# Patient Record
Sex: Male | Born: 1985 | Hispanic: No | Marital: Single | Smoking: Never smoker
Health system: Southern US, Community
[De-identification: ages and names within clinical notes are randomized; demographics above are authoritative.]

---

## 2006-08-21 ENCOUNTER — Encounter: Admission: RE | Admit: 2006-08-21 | Discharge: 2006-08-21 | Payer: Self-pay | Admitting: Gastroenterology

## 2008-08-30 ENCOUNTER — Emergency Department (HOSPITAL_COMMUNITY): Admission: EM | Admit: 2008-08-30 | Discharge: 2008-08-30 | Payer: Self-pay | Admitting: Family Medicine

## 2008-10-28 ENCOUNTER — Emergency Department (HOSPITAL_COMMUNITY): Admission: EM | Admit: 2008-10-28 | Discharge: 2008-10-28 | Payer: Self-pay | Admitting: Family Medicine

## 2009-06-02 ENCOUNTER — Emergency Department (HOSPITAL_COMMUNITY): Admission: EM | Admit: 2009-06-02 | Discharge: 2009-06-02 | Payer: Self-pay | Admitting: Family Medicine

## 2009-12-28 ENCOUNTER — Ambulatory Visit: Payer: Self-pay | Admitting: Internal Medicine

## 2009-12-28 DIAGNOSIS — S8990XA Unspecified injury of unspecified lower leg, initial encounter: Secondary | ICD-10-CM

## 2009-12-28 DIAGNOSIS — S99919A Unspecified injury of unspecified ankle, initial encounter: Secondary | ICD-10-CM

## 2009-12-28 DIAGNOSIS — S99929A Unspecified injury of unspecified foot, initial encounter: Secondary | ICD-10-CM | POA: Insufficient documentation

## 2010-01-04 ENCOUNTER — Encounter: Payer: Self-pay | Admitting: Internal Medicine

## 2010-02-01 ENCOUNTER — Encounter: Payer: Self-pay | Admitting: Internal Medicine

## 2010-03-04 ENCOUNTER — Encounter: Payer: Self-pay | Admitting: Internal Medicine

## 2010-03-05 LAB — CONVERTED CEMR LAB: Triglyceride fasting, serum: 227 mg/dL

## 2010-03-29 ENCOUNTER — Ambulatory Visit
Admission: RE | Admit: 2010-03-29 | Discharge: 2010-03-29 | Payer: Self-pay | Source: Home / Self Care | Attending: Internal Medicine | Admitting: Internal Medicine

## 2010-03-29 DIAGNOSIS — E785 Hyperlipidemia, unspecified: Secondary | ICD-10-CM | POA: Insufficient documentation
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
## 2010-04-07 NOTE — Assessment & Plan Note (Signed)
Summary: elev cholesterol/#/cd   Vital Signs:  Patient profile:   25 year old male Height:      71 inches Weight:      166 pounds BMI:     23.24 O2 Sat:      98 % on Room air Temp:     98.6 degrees F oral Pulse rate:   79 / minute Pulse rhythm:   regular Resp:     16 per minute BP sitting:   126 / 70  (left arm) Cuff size:   regular  Vitals Entered By: Rock Nephew CMA (March 29, 2010 1:58 PM)  O2 Flow:  Room air CC: follow-up visit//discuss labs Is Patient Diabetic? No Pain Assessment Patient in pain? no       Does patient need assistance? Functional Status Self care Ambulation Normal   Primary Care Provider:  Etta Grandchild MD  CC:  follow-up visit//discuss labs.  History of Present Illness: Dakota Hill. Dakota was started on Acutane almost one year ago and while Dakota was on Acutane his Hill worsened. Dakota has since stopped Acutane. Dakota feels well today with no complaints.  Preventive Screening-Counseling & Management  Alcohol-Tobacco     Alcohol drinks/day: <1     Alcohol type: all     >5/day in last 3 mos: no     Alcohol Counseling: not indicated; use of alcohol is not excessive or problematic     Feels need to cut down: no     Feels annoyed by complaints: no     Feels guilty re: drinking: no     Needs 'eye opener' in am: no     Smoking Status: never     Tobacco Counseling: not indicated; no tobacco use  Hep-HIV-STD-Contraception     Hepatitis Risk: no risk noted     HIV Risk: no risk noted     STD Risk: no risk noted      Drug Use:  no.        Blood Transfusions:  no.    Clinical Review Panels:  Lipid Management   Cholesterol:  283 (03/05/2010)   LDL (bad choesterol):  149 (03/05/2010)   HDL (good cholesterol):  39 (03/05/2010)   Triglycerides:  227 (03/05/2010)   -  Date:  03/05/2010    Cholesterol: 283    LDL: 149    HDL: 39    Triglycerides: 409    Current Lipid Medications VLDL-45, total chol/HDL  6.0 Labs done at [facility]  Medications Prior to Update: 1)  Vimovo 500-20 Mg Tbec (Naproxen-Esomeprazole) .... 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 Family History Hypertension  Social History: Reviewed history from 12/28/2009 and no changes required. 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    Physical Exam  General:  alert, well-developed, well-nourished, well-hydrated, appropriate dress, normal appearance, and healthy-appearing.   Mouth:  Oral mucosa and oropharynx without lesions or exudates.  Teeth in good repair. Neck:  supple, full ROM, no masses, no thyromegaly, no JVD, normal carotid upstroke, and no carotid bruits.   Lungs:  normal respiratory effort, no intercostal retractions, no accessory muscle use, normal breath sounds, no dullness, no fremitus, no crackles, and no wheezes.   Heart:  normal rate, regular rhythm, no murmur, no gallop, and no rub.     Abdomen:  soft, non-tender, normal bowel sounds, no distention, no masses, no guarding, no rigidity, no rebound tenderness, no abdominal hernia, and no inguinal hernia.   Msk:  left 5th toe is slightly swollen with mild diffuse ttp but no ecchymosis, abrasions, wounds, or boney derangements. there is good sensation and capillary refill. Pulses:  R and L carotid,radial,femoral,dorsalis pedis and posterior tibial pulses are full and equal bilaterally Extremities:  No clubbing, cyanosis, edema, or deformity noted with normal full range of motion of all joints.   Neurologic:  No cranial nerve deficits noted. Station and gait are normal. Plantar reflexes are down-going bilaterally. DTRs are symmetrical throughout. Sensory, motor and coordinative functions appear intact. Skin:  Intact without suspicious lesions or rashes Cervical Nodes:  No lymphadenopathy noted Psych:  Cognition and judgment appear intact. Alert and cooperative with normal attention span and concentration. No apparent delusions, illusions, hallucinations   Impression & Recommendations:  Problem # 1:  HYPERLIPIDEMIA (ICD-272.4) Assessment New no meds necessary, wil recheck FLP in the next few months   HDL:39 (03/05/2010)  LDL:149 (03/05/2010)  Chol:283 (03/05/2010)  Trig:227 (03/05/2010)  Patient Instructions: 1)  Please schedule a follow-up appointment in 3 months.   Orders Added: 1)  Est. Patient Level III [04540]

## 2010-05-19 LAB — POCT RAPID STREP A (OFFICE): Streptococcus, Group A Screen (Direct): POSITIVE — AB

## 2010-06-05 LAB — POCT RAPID STREP A (OFFICE): Streptococcus, Group A Screen (Direct): NEGATIVE

## 2010-06-05 LAB — POCT INFECTIOUS MONO SCREEN: Mono Screen: NEGATIVE

## 2011-11-14 ENCOUNTER — Ambulatory Visit (INDEPENDENT_AMBULATORY_CARE_PROVIDER_SITE_OTHER): Payer: 59 | Admitting: Internal Medicine

## 2011-11-14 ENCOUNTER — Other Ambulatory Visit: Payer: 59

## 2011-11-14 ENCOUNTER — Encounter: Payer: Self-pay | Admitting: Internal Medicine

## 2011-11-14 VITALS — BP 136/74 | HR 70 | Temp 97.7°F | Resp 16 | Wt 149.0 lb

## 2011-11-14 DIAGNOSIS — N61 Mastitis without abscess: Secondary | ICD-10-CM

## 2011-11-14 DIAGNOSIS — Z23 Encounter for immunization: Secondary | ICD-10-CM

## 2011-11-14 MED ORDER — SULFAMETHOXAZOLE-TRIMETHOPRIM 800-160 MG PO TABS: 1.0000 | ORAL_TABLET | Freq: Two times a day (BID) | ORAL | Status: AC

## 2011-11-14 NOTE — Patient Instructions (Signed)

## 2011-11-15 ENCOUNTER — Encounter: Payer: Self-pay | Admitting: Internal Medicine

## 2011-11-15 NOTE — Assessment & Plan Note (Signed)
Culture sent, I am concerned about mrsa so I have asked him to start bactrim-ds

## 2011-11-15 NOTE — Progress Notes (Signed)
  Subjective:    Patient ID: Dakota Hill, male    DOB: 10-22-1985, 26 y.o.   MRN: [id number]  HPI  He returns c/o pain, drainage, swelling from his left nipple for the last 10 days. There was no preceding trauma or injury.  Review of Systems  Constitutional: Negative.   HENT: Negative.   Eyes: Negative.   Respiratory: Negative.   Cardiovascular: Negative.   Gastrointestinal: Negative.   Genitourinary: Negative.   Musculoskeletal: Negative.   Skin: Negative.   Neurological: Negative.   Hematological: Negative.   Psychiatric/Behavioral: Negative.        Objective:   Physical Exam  Vitals reviewed. Constitutional: He is oriented to person, place, and time. He appears well-developed and well-nourished. No distress.  HENT:  Head: Normocephalic and atraumatic.  Mouth/Throat: Oropharynx is clear and moist. No oropharyngeal exudate.  Eyes: Conjunctivae normal are normal. Right eye exhibits no discharge. Left eye exhibits no discharge. No scleral icterus.  Neck: Normal range of motion. Neck supple. No JVD present. No tracheal deviation present. No thyromegaly present.  Cardiovascular: Normal rate, regular rhythm, normal heart sounds and intact distal pulses.  Exam reveals no gallop and no friction rub.   No murmur heard. Pulmonary/Chest: Effort normal and breath sounds normal. No stridor. No respiratory distress. He has no wheezes. He has no rales. He exhibits no mass and no tenderness. Right breast exhibits no inverted nipple, no mass, no nipple discharge, no skin change and no tenderness. Left breast exhibits nipple discharge, skin change and tenderness. Left breast exhibits no inverted nipple and no mass. Breasts are asymmetrical.    Abdominal: Soft. Bowel sounds are normal. He exhibits no distension and no mass. There is no tenderness. There is no rebound and no guarding.  Musculoskeletal: Normal range of motion. He exhibits no edema and no tenderness.  Lymphadenopathy:    He has  no cervical adenopathy.  Neurological: He is oriented to person, place, and time.  Skin: Skin is warm and dry. No rash noted. He is not diaphoretic. No erythema. No pallor.  Psychiatric: He has a normal mood and affect. His behavior is normal. Judgment and thought content normal.          Assessment & Plan:

## 2011-11-17 LAB — WOUND CULTURE

## 2011-11-23 ENCOUNTER — Telehealth: Payer: Self-pay | Admitting: *Deleted

## 2011-11-23 NOTE — Telephone Encounter (Signed)
Culture was positive for staph, the antibiotics should have been effective

## 2011-11-23 NOTE — Telephone Encounter (Signed)
Pt requesting results from last week's OV-he has not heard anything.

## 2011-11-24 NOTE — Telephone Encounter (Signed)
Patient notified

## 2012-04-16 ENCOUNTER — Ambulatory Visit (INDEPENDENT_AMBULATORY_CARE_PROVIDER_SITE_OTHER): Payer: 59 | Admitting: Internal Medicine

## 2012-04-16 ENCOUNTER — Encounter: Payer: Self-pay | Admitting: Internal Medicine

## 2012-04-16 VITALS — BP 124/72 | HR 77 | Temp 98.5°F | Ht 71.0 in | Wt 160.0 lb

## 2012-04-16 DIAGNOSIS — B029 Zoster without complications: Secondary | ICD-10-CM

## 2012-04-16 MED ORDER — GABAPENTIN 100 MG PO CAPS: 100.0000 mg | ORAL_CAPSULE | Freq: Three times a day (TID) | ORAL | Status: DC

## 2012-04-16 MED ORDER — VALACYCLOVIR HCL 1 G PO TABS: 1000.0000 mg | ORAL_TABLET | Freq: Three times a day (TID) | ORAL | Status: DC

## 2012-04-16 NOTE — Progress Notes (Signed)
  Subjective:    Patient ID: Dakota Hill, male    DOB: [birth year], 27 y.o.   MRN: [id number]  HPI  Pt presents to the clinic today with c/o a rash on the right side of his waist. He noticed it on Friday evening. It is painful. He has never had a rash like this before. He thinks it may be shingles. The pain is sharp and shooting. He feels like his belt is irritating the area. He has had chicken pox as a child.  Review of Systems  No past medical history on file.  No current outpatient prescriptions on file.   No current facility-administered medications for this visit.    No Known Allergies  Family History  Problem Relation Age of Onset  . Hypertension Other     History   Social History  . Marital Status: Single    Spouse Name: N/A    Number of Children: N/A  . Years of Education: N/A   Occupational History  . Not on file.   Social History Main Topics  . Smoking status: Never Smoker   . Smokeless tobacco: Not on file  . Alcohol Use: No  . Drug Use: No  . Sexually Active: Not Currently   Other Topics Concern  . Not on file   Social History Narrative  . No narrative on file     Constitutional: Denies fever, malaise, fatigue, headache or abrupt weight changes.  Skin: Pt reports rash on right side of abdomen.  Neurological: Denies dizziness, difficulty with memory, difficulty with speech or problems with balance and coordination.   No other specific complaints in a complete review of systems (except as listed in HPI above).     Objective:   Physical Exam   BP 124/72  Pulse 77  Temp(Src) 98.5 F (36.9 C) (Oral)  Ht 5\' 11"  (1.803 m)  Wt 160 lb (72.576 kg)  BMI 22.33 kg/m2  SpO2 98% Wt Readings from Last 3 Encounters:  04/16/12 160 lb (72.576 kg)  11/14/11 149 lb (67.586 kg)  03/29/10 166 lb (75.297 kg)    General: Appears their stated age, well developed, well nourished in NAD. Skin: Linear patch of vesicular lesions on erythematous base resembling  shingles on right side of abdomen.  Cardiovascular: Normal rate and rhythm. S1,S2 noted.  No murmur, rubs or gallops noted. No JVD or BLE edema. No carotid bruits noted. Pulmonary/Chest: Normal effort and positive vesicular breath sounds. No respiratory distress. No wheezes, rales or ronchi noted.  Abdomen: Soft and nontender. Normal bowel sounds, no bruits noted. No distention or masses noted. Liver, spleen and kidneys non palpable.  Neurological: Alert and oriented. Cranial nerves II-XII intact. Coordination normal. +DTRs bilaterally.         Assessment & Plan:   Shingles, new onset with additional workup required:  eRx for Valtrex TID x 7 days eRx for Neurontin TID x 7-14 days Wash with warm soap and water  RTC as needed or if symptoms persist

## 2012-04-16 NOTE — Patient Instructions (Signed)

## 2013-05-08 ENCOUNTER — Ambulatory Visit: Payer: 59 | Admitting: Internal Medicine

## 2013-10-14 ENCOUNTER — Encounter: Payer: Self-pay | Admitting: Internal Medicine

## 2013-10-14 ENCOUNTER — Ambulatory Visit (INDEPENDENT_AMBULATORY_CARE_PROVIDER_SITE_OTHER): Payer: Managed Care, Other (non HMO) | Admitting: Internal Medicine

## 2013-10-14 ENCOUNTER — Other Ambulatory Visit (INDEPENDENT_AMBULATORY_CARE_PROVIDER_SITE_OTHER): Payer: Managed Care, Other (non HMO)

## 2013-10-14 VITALS — BP 138/72 | HR 77 | Temp 98.4°F | Resp 16 | Ht 71.0 in | Wt 170.0 lb

## 2013-10-14 DIAGNOSIS — K602 Anal fissure, unspecified: Secondary | ICD-10-CM

## 2013-10-14 DIAGNOSIS — L649 Androgenic alopecia, unspecified: Secondary | ICD-10-CM | POA: Insufficient documentation

## 2013-10-14 DIAGNOSIS — Z0000 Encounter for general adult medical examination without abnormal findings: Secondary | ICD-10-CM | POA: Insufficient documentation

## 2013-10-14 DIAGNOSIS — L658 Other specified nonscarring hair loss: Secondary | ICD-10-CM

## 2013-10-14 LAB — CBC WITH DIFFERENTIAL/PLATELET
BASOS ABS: 0 10*3/uL (ref 0.0–0.1)
Basophils Relative: 0.8 % (ref 0.0–3.0)
EOS PCT: 2.6 % (ref 0.0–5.0)
Eosinophils Absolute: 0.1 10*3/uL (ref 0.0–0.7)
HCT: 45.4 % (ref 39.0–52.0)
Hemoglobin: 15.5 g/dL (ref 13.0–17.0)
Lymphocytes Relative: 35.1 % (ref 12.0–46.0)
Lymphs Abs: 1.9 10*3/uL (ref 0.7–4.0)
MCHC: 34.2 g/dL (ref 30.0–36.0)
MCV: 89.1 fl (ref 78.0–100.0)
MONO ABS: 0.5 10*3/uL (ref 0.1–1.0)
Monocytes Relative: 8.7 % (ref 3.0–12.0)
NEUTROS PCT: 52.8 % (ref 43.0–77.0)
Neutro Abs: 2.9 10*3/uL (ref 1.4–7.7)
PLATELETS: 229 10*3/uL (ref 150.0–400.0)
RBC: 5.1 Mil/uL (ref 4.22–5.81)
RDW: 13.1 % (ref 11.5–15.5)
WBC: 5.4 10*3/uL (ref 4.0–10.5)

## 2013-10-14 LAB — LIPID PANEL
CHOLESTEROL: 211 mg/dL — AB (ref 0–200)
HDL: 44.8 mg/dL (ref >39.00)
LDL Cholesterol: 145 mg/dL — ABNORMAL HIGH (ref 0–99)
NonHDL: 166.2
Total CHOL/HDL Ratio: 5
Triglycerides: 108 mg/dL (ref 0.0–149.0)
VLDL: 21.6 mg/dL (ref 0.0–40.0)

## 2013-10-14 LAB — COMPREHENSIVE METABOLIC PANEL
ALBUMIN: 4.3 g/dL (ref 3.5–5.2)
ALK PHOS: 81 U/L (ref 39–117)
ALT: 37 U/L (ref 0–53)
AST: 76 U/L — ABNORMAL HIGH (ref 0–37)
BUN: 6 mg/dL (ref 6–23)
CO2: 26 meq/L (ref 19–32)
Calcium: 9.3 mg/dL (ref 8.4–10.5)
Chloride: 106 mEq/L (ref 96–112)
Creatinine, Ser: 0.9 mg/dL (ref 0.4–1.5)
GFR: 111.5 mL/min (ref >60.00)
GLUCOSE: 86 mg/dL (ref 70–99)
POTASSIUM: 4.3 meq/L (ref 3.5–5.1)
SODIUM: 142 meq/L (ref 135–145)
TOTAL PROTEIN: 7.1 g/dL (ref 6.0–8.3)
Total Bilirubin: 0.6 mg/dL (ref 0.2–1.2)

## 2013-10-14 LAB — TSH: TSH: 1.6 u[IU]/mL (ref 0.35–4.50)

## 2013-10-14 MED ORDER — FINASTERIDE 1 MG PO TABS: 1.0000 mg | ORAL_TABLET | Freq: Every day | ORAL | Status: DC

## 2013-10-14 MED ORDER — TRIAMCINOLONE ACETONIDE 0.5 % EX CREA: 1.0000 "application " | TOPICAL_CREAM | Freq: Three times a day (TID) | CUTANEOUS | Status: DC

## 2013-10-14 NOTE — Assessment & Plan Note (Signed)
Will start propecia

## 2013-10-14 NOTE — Assessment & Plan Note (Signed)
Exam done Vaccines were reviewed Labs ordered Pt ed material was given 

## 2013-10-14 NOTE — Progress Notes (Signed)
   Subjective:    Patient ID: Dakota Hill, male    DOB: 12/28/1985, 28 y.o.   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     Review of Systems  Constitutional: Negative.   HENT: Negative.   Eyes: Negative.   Respiratory: Negative.  Negative for cough, choking, chest tightness, shortness of breath and stridor.   Cardiovascular: Negative.  Negative for chest pain, palpitations and leg swelling.  Gastrointestinal: Positive for anal bleeding and rectal pain. Negative for nausea, vomiting, abdominal pain, diarrhea, constipation, blood in stool and abdominal distention.  Endocrine: Negative.   Genitourinary: Negative.  Negative for dysuria, urgency, decreased urine volume, penile swelling, scrotal swelling, enuresis, difficulty urinating, genital sores and penile pain.  Musculoskeletal: Negative.  Negative for arthralgias, back pain and myalgias.  Skin: Negative.  Negative for rash.  Allergic/Immunologic: Negative.   Neurological: Negative.   Hematological: Negative.  Negative for adenopathy. Does not bruise/bleed easily.  Psychiatric/Behavioral: Negative.        Objective:   Physical Exam  Vitals reviewed. Constitutional: He is oriented to person, place, and time. He appears well-developed and well-nourished.  HENT:  Head: Normocephalic and atraumatic.  Mouth/Throat: Oropharynx is clear and moist. No oropharyngeal exudate.  Eyes: Conjunctivae are normal. Right eye exhibits no discharge. Left eye exhibits no discharge. No scleral icterus.  Neck: Normal range of motion. Neck supple. No JVD present. No tracheal deviation present. No thyromegaly present.  Cardiovascular: Normal rate, regular rhythm, normal heart sounds and intact distal pulses.  Exam reveals no  gallop and no friction rub.   No murmur heard. Pulmonary/Chest: Effort normal and breath sounds normal. No stridor. No respiratory distress. He has no wheezes. He has no rales. He exhibits no tenderness.  Abdominal: Soft. Bowel sounds are normal. He exhibits no distension and no mass. There is no tenderness. There is no rebound and no guarding. Hernia confirmed negative in the right inguinal area and confirmed negative in the left inguinal area.  Genitourinary: Prostate normal and penis normal. Rectal exam shows internal hemorrhoid and fissure. Rectal exam shows no mass, no tenderness and anal tone normal. Guaiac negative stool. Prostate is not enlarged and not tender. Right testis shows no mass, no swelling and no tenderness. Right testis is descended. Left testis shows no mass, no swelling and no tenderness. Left testis is descended. Circumcised. No penile erythema or penile tenderness. No discharge found.     Musculoskeletal: Normal range of motion. He exhibits no edema and no tenderness.  Lymphadenopathy:    He has no cervical adenopathy.       Right: No inguinal adenopathy present.       Left: No inguinal adenopathy present.  Neurological: He is oriented to person, place, and time.  Skin: Skin is warm and dry. No rash noted. He is not diaphoretic. No erythema. No pallor.  Psychiatric: He has a normal mood and affect. His behavior is normal. Judgment and thought content normal.      Lab Results  Component Value Date   CHOL 283 03/05/2010   HDL 39 03/05/2010   LDLCALC 829149 03/05/2010      Assessment & Plan:

## 2013-10-14 NOTE — Assessment & Plan Note (Signed)
I am concerned that he may have contact dermatitis, will treat this with TAC cream for now I am also concerned about HSV 1 and 2 infection so will screen for that as well as HIV

## 2013-10-14 NOTE — Progress Notes (Signed)
Pre visit review using our clinic review tool, if applicable. No additional management support is needed unless otherwise documented below in the visit note. 

## 2013-10-14 NOTE — Patient Instructions (Signed)

## 2013-10-15 LAB — HSV(HERPES SMPLX)ABS-I+II(IGG+IGM)-BLD
HERPES SIMPLEX VRS I-IGM AB (EIA): 0.13 {index}
HSV 1 Glycoprotein G Ab, IgG: <0.1 IV
HSV 2 Glycoprotein G Ab, IgG: <0.1 IV

## 2013-10-15 LAB — HIV ANTIBODY (ROUTINE TESTING W REFLEX): HIV: NONREACTIVE

## 2013-10-17 ENCOUNTER — Encounter: Payer: Self-pay | Admitting: Internal Medicine

## 2013-12-13 ENCOUNTER — Other Ambulatory Visit: Payer: Self-pay

## 2014-10-11 ENCOUNTER — Encounter (HOSPITAL_BASED_OUTPATIENT_CLINIC_OR_DEPARTMENT_OTHER): Payer: Self-pay | Admitting: Emergency Medicine

## 2014-10-11 ENCOUNTER — Emergency Department (HOSPITAL_BASED_OUTPATIENT_CLINIC_OR_DEPARTMENT_OTHER): Payer: Managed Care, Other (non HMO)

## 2014-10-11 ENCOUNTER — Emergency Department (HOSPITAL_BASED_OUTPATIENT_CLINIC_OR_DEPARTMENT_OTHER)
Admission: EM | Admit: 2014-10-11 | Discharge: 2014-10-11 | Disposition: A | Discharge status: Home / Self Care | Payer: Managed Care, Other (non HMO) | Attending: Emergency Medicine | Admitting: Emergency Medicine

## 2014-10-11 DIAGNOSIS — M62838 Other muscle spasm: Secondary | ICD-10-CM | POA: Insufficient documentation

## 2014-10-11 DIAGNOSIS — Y92410 Unspecified street and highway as the place of occurrence of the external cause: Secondary | ICD-10-CM | POA: Diagnosis not present

## 2014-10-11 DIAGNOSIS — S4992XA Unspecified injury of left shoulder and upper arm, initial encounter: Secondary | ICD-10-CM | POA: Diagnosis present

## 2014-10-11 DIAGNOSIS — Y9389 Activity, other specified: Secondary | ICD-10-CM | POA: Diagnosis not present

## 2014-10-11 DIAGNOSIS — Y998 Other external cause status: Secondary | ICD-10-CM | POA: Diagnosis not present

## 2014-10-11 MED ORDER — METHOCARBAMOL 500 MG PO TABS: 500.0000 mg | ORAL_TABLET | Freq: Two times a day (BID) | ORAL | Status: DC

## 2014-10-11 MED ORDER — NAPROXEN 375 MG PO TABS: 375.0000 mg | ORAL_TABLET | Freq: Two times a day (BID) | ORAL | Status: DC

## 2014-10-11 MED ORDER — NAPROXEN 250 MG PO TABS
500.0000 mg | ORAL_TABLET | Freq: Once | ORAL | Status: AC
  Administered 2014-10-11: 500 mg via ORAL
  Filled 2014-10-11: qty 2

## 2014-10-11 MED ORDER — METHOCARBAMOL 500 MG PO TABS
1000.0000 mg | ORAL_TABLET | Freq: Once | ORAL | Status: AC
  Administered 2014-10-11: 1000 mg via ORAL
  Filled 2014-10-11: qty 2

## 2014-10-11 NOTE — Discharge Instructions (Signed)
Heat Therapy °Heat therapy can help make painful, stiff muscles and joints feel better. Do not use heat on new injuries. Wait at least 48 hours after an injury to use heat. Do not use heat when you have aches or pains right after an activity. If you still have pain 3 hours after stopping the activity, then you may use heat. °HOME CARE °Wet heat pack °· Soak a clean towel in warm water. Squeeze out the extra water. °· Put the warm, wet towel in a plastic bag. °· Place a thin, dry towel between your skin and the bag. °· Put the heat pack on the area for 5 minutes, and check your skin. Your skin may be pink, but it should not be red. °· Leave the heat pack on the area for 15 to 30 minutes. °· Repeat this every 2 to 4 hours while awake. Do not use heat while you are sleeping. °Warm water bath °· Fill a tub with warm water. °· Place the affected body part in the tub. °· Soak the area for 20 to 40 minutes. °· Repeat as needed. °Hot water bottle °· Fill the water bottle half full with hot water. °· Press out the extra air. Close the cap tightly. °· Place a dry towel between your skin and the bottle. °· Put the bottle on the area for 5 minutes, and check your skin. Your skin may be pink, but it should not be red. °· Leave the bottle on the area for 15 to 30 minutes. °· Repeat this every 2 to 4 hours while awake. °Electric heating pad °· Place a dry towel between your skin and the heating pad. °· Set the heating pad on low heat. °· Put the heating pad on the area for 10 minutes, and check your skin. Your skin may be pink, but it should not be red. °· Leave the heating pad on the area for 20 to 40 minutes. °· Repeat this every 2 to 4 hours while awake. °· Do not lie on the heating pad. °· Do not fall asleep while using the heating pad. °· Do not use the heating pad near water. °GET HELP RIGHT AWAY IF: °· You get blisters or red skin. °· Your skin is puffy (swollen), or you lose feeling (numbness) in the affected area. °· You  have any new problems. °· Your problems are getting worse. °· You have any questions or concerns. °If you have any problems, stop using heat therapy until you see your doctor. °MAKE SURE YOU: °· Understand these instructions. °· Will watch your condition. °· Will get help right away if you are not doing well or get worse. °Document Released: 05/09/2011 Document Reviewed: 04/09/2013 °[employer]® Patient Information ©2015 [employer]. This information is not intended to replace advice given to you by your health care provider. Make sure you discuss any questions you have with your health care provider. ° °
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
## 2014-10-29 ENCOUNTER — Ambulatory Visit (INDEPENDENT_AMBULATORY_CARE_PROVIDER_SITE_OTHER)
Admission: RE | Admit: 2014-10-29 | Discharge: 2014-10-29 | Disposition: A | Discharge status: Home / Self Care | Payer: Managed Care, Other (non HMO) | Source: Ambulatory Visit | Attending: Internal Medicine | Admitting: Internal Medicine

## 2014-10-29 ENCOUNTER — Encounter: Payer: Self-pay | Admitting: Internal Medicine

## 2014-10-29 ENCOUNTER — Ambulatory Visit (INDEPENDENT_AMBULATORY_CARE_PROVIDER_SITE_OTHER): Payer: Managed Care, Other (non HMO) | Admitting: Internal Medicine

## 2014-10-29 VITALS — BP 120/82 | HR 79 | Temp 98.3°F | Resp 16 | Ht 72.0 in | Wt 166.0 lb

## 2014-10-29 DIAGNOSIS — M546 Pain in thoracic spine: Secondary | ICD-10-CM | POA: Insufficient documentation

## 2014-10-29 DIAGNOSIS — M549 Dorsalgia, unspecified: Secondary | ICD-10-CM

## 2014-10-29 MED ORDER — NAPROXEN 375 MG PO TABS: 375.0000 mg | ORAL_TABLET | Freq: Two times a day (BID) | ORAL | Status: DC

## 2014-10-29 NOTE — Patient Instructions (Signed)
Back Pain, Adult Low back pain is very common. About 1 in 5 people have back pain.The cause of low back pain is rarely dangerous. The pain often gets better over time.About half of people with a sudden onset of back pain feel better in just 2 weeks. About 8 in 10 people feel better by 6 weeks.  CAUSES Some common causes of back pain include:  Strain of the muscles or ligaments supporting the spine.  Wear and tear (degeneration) of the spinal discs.  Arthritis.  Direct injury to the back. DIAGNOSIS Most of the time, the direct cause of low back pain is not known.However, back pain can be treated effectively even when the exact cause of the pain is unknown.Answering your caregiver's questions about your overall health and symptoms is one of the most accurate ways to make sure the cause of your pain is not dangerous. If your caregiver needs more information, he or she may order lab work or imaging tests (X-rays or MRIs).However, even if imaging tests show changes in your back, this usually does not require surgery. HOME CARE INSTRUCTIONS For many people, back pain returns.Since low back pain is rarely dangerous, it is often a condition that people can learn to manageon their own.   Remain active. It is stressful on the back to sit or stand in one place. Do not sit, drive, or stand in one place for more than 30 minutes at a time. Take short walks on level surfaces as soon as pain allows.Try to increase the length of time you walk each day.  Do not stay in bed.Resting more than 1 or 2 days can delay your recovery.  Do not avoid exercise or work.Your body is made to move.It is not dangerous to be active, even though your back may hurt.Your back will likely heal faster if you return to being active before your pain is gone.  Pay attention to your body when you bend and lift. Many people have less discomfortwhen lifting if they bend their knees, keep the load close to their bodies,and  avoid twisting. Often, the most comfortable positions are those that put less stress on your recovering back.  Find a comfortable position to sleep. Use a firm mattress and lie on your side with your knees slightly bent. If you lie on your back, put a pillow under your knees.  Only take over-the-counter or prescription medicines as directed by your caregiver. Over-the-counter medicines to reduce pain and inflammation are often the most helpful.Your caregiver may prescribe muscle relaxant drugs.These medicines help dull your pain so you can more quickly return to your normal activities and healthy exercise.  Put ice on the injured area.  Put ice in a plastic bag.  Place a towel between your skin and the bag.  Leave the ice on for 15-20 minutes, 03-04 times a day for the first 2 to 3 days. After that, ice and heat may be alternated to reduce pain and spasms.  Ask your caregiver about trying back exercises and gentle massage. This may be of some benefit.  Avoid feeling anxious or stressed.Stress increases muscle tension and can worsen back pain.It is important to recognize when you are anxious or stressed and learn ways to manage it.Exercise is a great option. SEEK MEDICAL CARE IF:  You have pain that is not relieved with rest or medicine.  You have pain that does not improve in 1 week.  You have new symptoms.  You are generally not feeling well. SEEK   IMMEDIATE MEDICAL CARE IF:   You have pain that radiates from your back into your legs.  You develop new bowel or bladder control problems.  You have unusual weakness or numbness in your arms or legs.  You develop nausea or vomiting.  You develop abdominal pain.  You feel faint. Document Released: 02/14/2005 Document Revised: 08/16/2011 Document Reviewed: 06/18/2013 Exit[employer] Patient Information 2015 [employer]. This information is not intended to replace advice given to you by your health care provider. Make sure you  discuss any questions you have with your health care provider.  

## 2014-10-29 NOTE — Progress Notes (Signed)
Pre visit review using our clinic review tool, if applicable. No additional management support is needed unless otherwise documented below in the visit note. 
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
## 2014-12-10 ENCOUNTER — Ambulatory Visit (INDEPENDENT_AMBULATORY_CARE_PROVIDER_SITE_OTHER): Payer: Managed Care, Other (non HMO) | Admitting: Internal Medicine

## 2014-12-10 ENCOUNTER — Other Ambulatory Visit (INDEPENDENT_AMBULATORY_CARE_PROVIDER_SITE_OTHER): Payer: Managed Care, Other (non HMO)

## 2014-12-10 ENCOUNTER — Encounter: Payer: Self-pay | Admitting: Internal Medicine

## 2014-12-10 VITALS — BP 128/60 | HR 69 | Temp 98.6°F | Resp 16 | Ht 72.0 in | Wt 161.0 lb

## 2014-12-10 DIAGNOSIS — Z0000 Encounter for general adult medical examination without abnormal findings: Secondary | ICD-10-CM

## 2014-12-10 DIAGNOSIS — L649 Androgenic alopecia, unspecified: Secondary | ICD-10-CM

## 2014-12-10 LAB — CBC WITH DIFFERENTIAL/PLATELET
BASOS ABS: 0 10*3/uL (ref 0.0–0.1)
BASOS PCT: 0.4 % (ref 0.0–3.0)
Eosinophils Absolute: 0.1 10*3/uL (ref 0.0–0.7)
Eosinophils Relative: 1.6 % (ref 0.0–5.0)
HEMATOCRIT: 48.9 % (ref 39.0–52.0)
Hemoglobin: 16.5 g/dL (ref 13.0–17.0)
LYMPHS ABS: 2.6 10*3/uL (ref 0.7–4.0)
LYMPHS PCT: 36.7 % (ref 12.0–46.0)
MCHC: 33.6 g/dL (ref 30.0–36.0)
MCV: 90.8 fl (ref 78.0–100.0)
MONOS PCT: 8.2 % (ref 3.0–12.0)
Monocytes Absolute: 0.6 10*3/uL (ref 0.1–1.0)
NEUTROS ABS: 3.8 10*3/uL (ref 1.4–7.7)
NEUTROS PCT: 53.1 % (ref 43.0–77.0)
PLATELETS: 237 10*3/uL (ref 150.0–400.0)
RBC: 5.39 Mil/uL (ref 4.22–5.81)
RDW: 12.4 % (ref 11.5–15.5)
WBC: 7.1 10*3/uL (ref 4.0–10.5)

## 2014-12-10 LAB — COMPREHENSIVE METABOLIC PANEL
ALT: 19 U/L (ref 0–53)
AST: 18 U/L (ref 0–37)
Albumin: 4.7 g/dL (ref 3.5–5.2)
Alkaline Phosphatase: 66 U/L (ref 39–117)
BILIRUBIN TOTAL: 0.8 mg/dL (ref 0.2–1.2)
BUN: 12 mg/dL (ref 6–23)
CALCIUM: 9.6 mg/dL (ref 8.4–10.5)
CHLORIDE: 104 meq/L (ref 96–112)
CO2: 30 meq/L (ref 19–32)
Creatinine, Ser: 0.82 mg/dL (ref 0.40–1.50)
GFR: 118.39 mL/min (ref >60.00)
GLUCOSE: 92 mg/dL (ref 70–99)
Potassium: 4.3 mEq/L (ref 3.5–5.1)
Sodium: 139 mEq/L (ref 135–145)
Total Protein: 7.4 g/dL (ref 6.0–8.3)

## 2014-12-10 LAB — URINALYSIS, ROUTINE W REFLEX MICROSCOPIC
BILIRUBIN URINE: NEGATIVE
Hgb urine dipstick: NEGATIVE
Ketones, ur: NEGATIVE
Leukocytes, UA: NEGATIVE
Nitrite: NEGATIVE
RBC / HPF: NONE SEEN (ref 0–?)
TOTAL PROTEIN, URINE-UPE24: NEGATIVE
Urine Glucose: NEGATIVE
Urobilinogen, UA: 0.2 (ref 0.0–1.0)
pH: 6 (ref 5.0–8.0)

## 2014-12-10 LAB — LIPID PANEL
CHOL/HDL RATIO: 5
Cholesterol: 229 mg/dL — ABNORMAL HIGH (ref 0–200)
HDL: 47.3 mg/dL (ref >39.00)
LDL CALC: 160 mg/dL — AB (ref 0–99)
NonHDL: 181.23
Triglycerides: 104 mg/dL (ref 0.0–149.0)
VLDL: 20.8 mg/dL (ref 0.0–40.0)

## 2014-12-10 LAB — TSH: TSH: 0.99 u[IU]/mL (ref 0.35–4.50)

## 2014-12-10 MED ORDER — FINASTERIDE 1 MG PO TABS: 1.0000 mg | ORAL_TABLET | Freq: Every day | ORAL | Status: AC

## 2014-12-10 NOTE — Patient Instructions (Signed)

## 2014-12-10 NOTE — Progress Notes (Signed)
Pre visit review using our clinic review tool, if applicable. No additional management support is needed unless otherwise documented below in the visit note. 

## 2014-12-11 ENCOUNTER — Encounter: Payer: Self-pay | Admitting: Internal Medicine

## 2014-12-11 LAB — RPR

## 2014-12-11 LAB — HIV ANTIBODY (ROUTINE TESTING W REFLEX): HIV 1&2 Ab, 4th Generation: NONREACTIVE
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
## 2015-08-18 ENCOUNTER — Encounter: Payer: Self-pay | Admitting: Internal Medicine
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
## 2016-09-22 ENCOUNTER — Ambulatory Visit (INDEPENDENT_AMBULATORY_CARE_PROVIDER_SITE_OTHER): Payer: Commercial Managed Care - PPO | Admitting: Internal Medicine

## 2016-09-22 ENCOUNTER — Encounter: Payer: Self-pay | Admitting: Internal Medicine

## 2016-09-22 ENCOUNTER — Other Ambulatory Visit (INDEPENDENT_AMBULATORY_CARE_PROVIDER_SITE_OTHER): Payer: Commercial Managed Care - PPO

## 2016-09-22 VITALS — BP 130/80 | HR 69 | Temp 99.4°F | Resp 16 | Ht 72.0 in | Wt 170.2 lb

## 2016-09-22 DIAGNOSIS — Z0000 Encounter for general adult medical examination without abnormal findings: Secondary | ICD-10-CM

## 2016-09-22 LAB — COMPREHENSIVE METABOLIC PANEL
ALT: 23 U/L (ref 0–53)
AST: 20 U/L (ref 0–37)
Albumin: 4.8 g/dL (ref 3.5–5.2)
Alkaline Phosphatase: 67 U/L (ref 39–117)
BUN: 9 mg/dL (ref 6–23)
CHLORIDE: 104 meq/L (ref 96–112)
CO2: 27 mEq/L (ref 19–32)
CREATININE: 0.91 mg/dL (ref 0.40–1.50)
Calcium: 9.9 mg/dL (ref 8.4–10.5)
GFR: 103.7 mL/min (ref >60.00)
GLUCOSE: 97 mg/dL (ref 70–99)
Potassium: 3.9 mEq/L (ref 3.5–5.1)
SODIUM: 139 meq/L (ref 135–145)
Total Bilirubin: 0.9 mg/dL (ref 0.2–1.2)
Total Protein: 7.9 g/dL (ref 6.0–8.3)

## 2016-09-22 LAB — CBC WITH DIFFERENTIAL/PLATELET
BASOS PCT: 0.6 % (ref 0.0–3.0)
Basophils Absolute: 0.1 10*3/uL (ref 0.0–0.1)
EOS ABS: 0.1 10*3/uL (ref 0.0–0.7)
Eosinophils Relative: 1.2 % (ref 0.0–5.0)
HCT: 48.3 % (ref 39.0–52.0)
HEMOGLOBIN: 16.5 g/dL (ref 13.0–17.0)
LYMPHS ABS: 2.4 10*3/uL (ref 0.7–4.0)
Lymphocytes Relative: 27.9 % (ref 12.0–46.0)
MCHC: 34.1 g/dL (ref 30.0–36.0)
MCV: 89.3 fl (ref 78.0–100.0)
MONO ABS: 0.6 10*3/uL (ref 0.1–1.0)
Monocytes Relative: 7.1 % (ref 3.0–12.0)
NEUTROS ABS: 5.4 10*3/uL (ref 1.4–7.7)
NEUTROS PCT: 63.2 % (ref 43.0–77.0)
PLATELETS: 223 10*3/uL (ref 150.0–400.0)
RBC: 5.4 Mil/uL (ref 4.22–5.81)
RDW: 12.5 % (ref 11.5–15.5)
WBC: 8.6 10*3/uL (ref 4.0–10.5)

## 2016-09-22 LAB — LIPID PANEL
Cholesterol: 224 mg/dL — ABNORMAL HIGH (ref 0–200)
HDL: 45.7 mg/dL (ref >39.00)
LDL CALC: 154 mg/dL — AB (ref 0–99)
NonHDL: 178.54
Total CHOL/HDL Ratio: 5
Triglycerides: 122 mg/dL (ref 0.0–149.0)
VLDL: 24.4 mg/dL (ref 0.0–40.0)

## 2016-09-22 LAB — TSH: TSH: 1.14 u[IU]/mL (ref 0.35–4.50)
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
## 2016-09-22 NOTE — Patient Instructions (Signed)
